# Patient Record
Sex: Male | Born: 2000 | Race: White | Hispanic: No | Marital: Single | Smoking: Never smoker
Health system: Southern US, Community
[De-identification: ages and names within clinical notes are randomized; demographics above are authoritative.]

## PROBLEM LIST (undated history)

## (undated) DIAGNOSIS — T7840XA Allergy, unspecified, initial encounter: Secondary | ICD-10-CM

## (undated) DIAGNOSIS — S060X9A Concussion with loss of consciousness of unspecified duration, initial encounter: Secondary | ICD-10-CM

## (undated) DIAGNOSIS — S82891A Other fracture of right lower leg, initial encounter for closed fracture: Secondary | ICD-10-CM

## (undated) HISTORY — PX: NO PAST SURGERIES: SHX2092

---
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
## 2023-10-23 ENCOUNTER — Ambulatory Visit: Payer: Self-pay
